# Patient Record
Sex: Female | Born: 1935 | Race: White | Hispanic: No | Marital: Married | Smoking: Former smoker
Health system: Southern US, Community
[De-identification: ages and names within clinical notes are randomized; demographics above are authoritative.]

## PROBLEM LIST (undated history)

## (undated) DIAGNOSIS — I771 Stricture of artery: Secondary | ICD-10-CM

## (undated) DIAGNOSIS — E785 Hyperlipidemia, unspecified: Secondary | ICD-10-CM

## (undated) DIAGNOSIS — IMO0001 Reserved for inherently not codable concepts without codable children: Secondary | ICD-10-CM

## (undated) DIAGNOSIS — I10 Essential (primary) hypertension: Secondary | ICD-10-CM

## (undated) DIAGNOSIS — N390 Urinary tract infection, site not specified: Secondary | ICD-10-CM

## (undated) DIAGNOSIS — Z0389 Encounter for observation for other suspected diseases and conditions ruled out: Secondary | ICD-10-CM

## (undated) HISTORY — DX: Encounter for observation for other suspected diseases and conditions ruled out: Z03.89

## (undated) HISTORY — DX: Hyperlipidemia, unspecified: E78.5

## (undated) HISTORY — DX: Urinary tract infection, site not specified: N39.0

## (undated) HISTORY — DX: Essential (primary) hypertension: I10

## (undated) HISTORY — DX: Stricture of artery: I77.1

## (undated) HISTORY — DX: Reserved for inherently not codable concepts without codable children: IMO0001

---

## 2005-04-22 ENCOUNTER — Other Ambulatory Visit: Admission: RE | Admit: 2005-04-22 | Discharge: 2005-04-22 | Payer: Self-pay | Admitting: Dermatology

## 2005-08-09 ENCOUNTER — Observation Stay (HOSPITAL_COMMUNITY): Admission: EM | Admit: 2005-08-09 | Discharge: 2005-08-10 | Payer: Self-pay | Admitting: Emergency Medicine

## 2007-02-17 IMAGING — CR DG CHEST 1V PORT
1 series · 1 of 1 positions shown · non-contrast
Comparison: none

CLINICAL DATA: Mid chest pain that extends into the right shoulder.
 PORTABLE CHEST - 1 VIEW - 08/09/05:

[view not recorded]
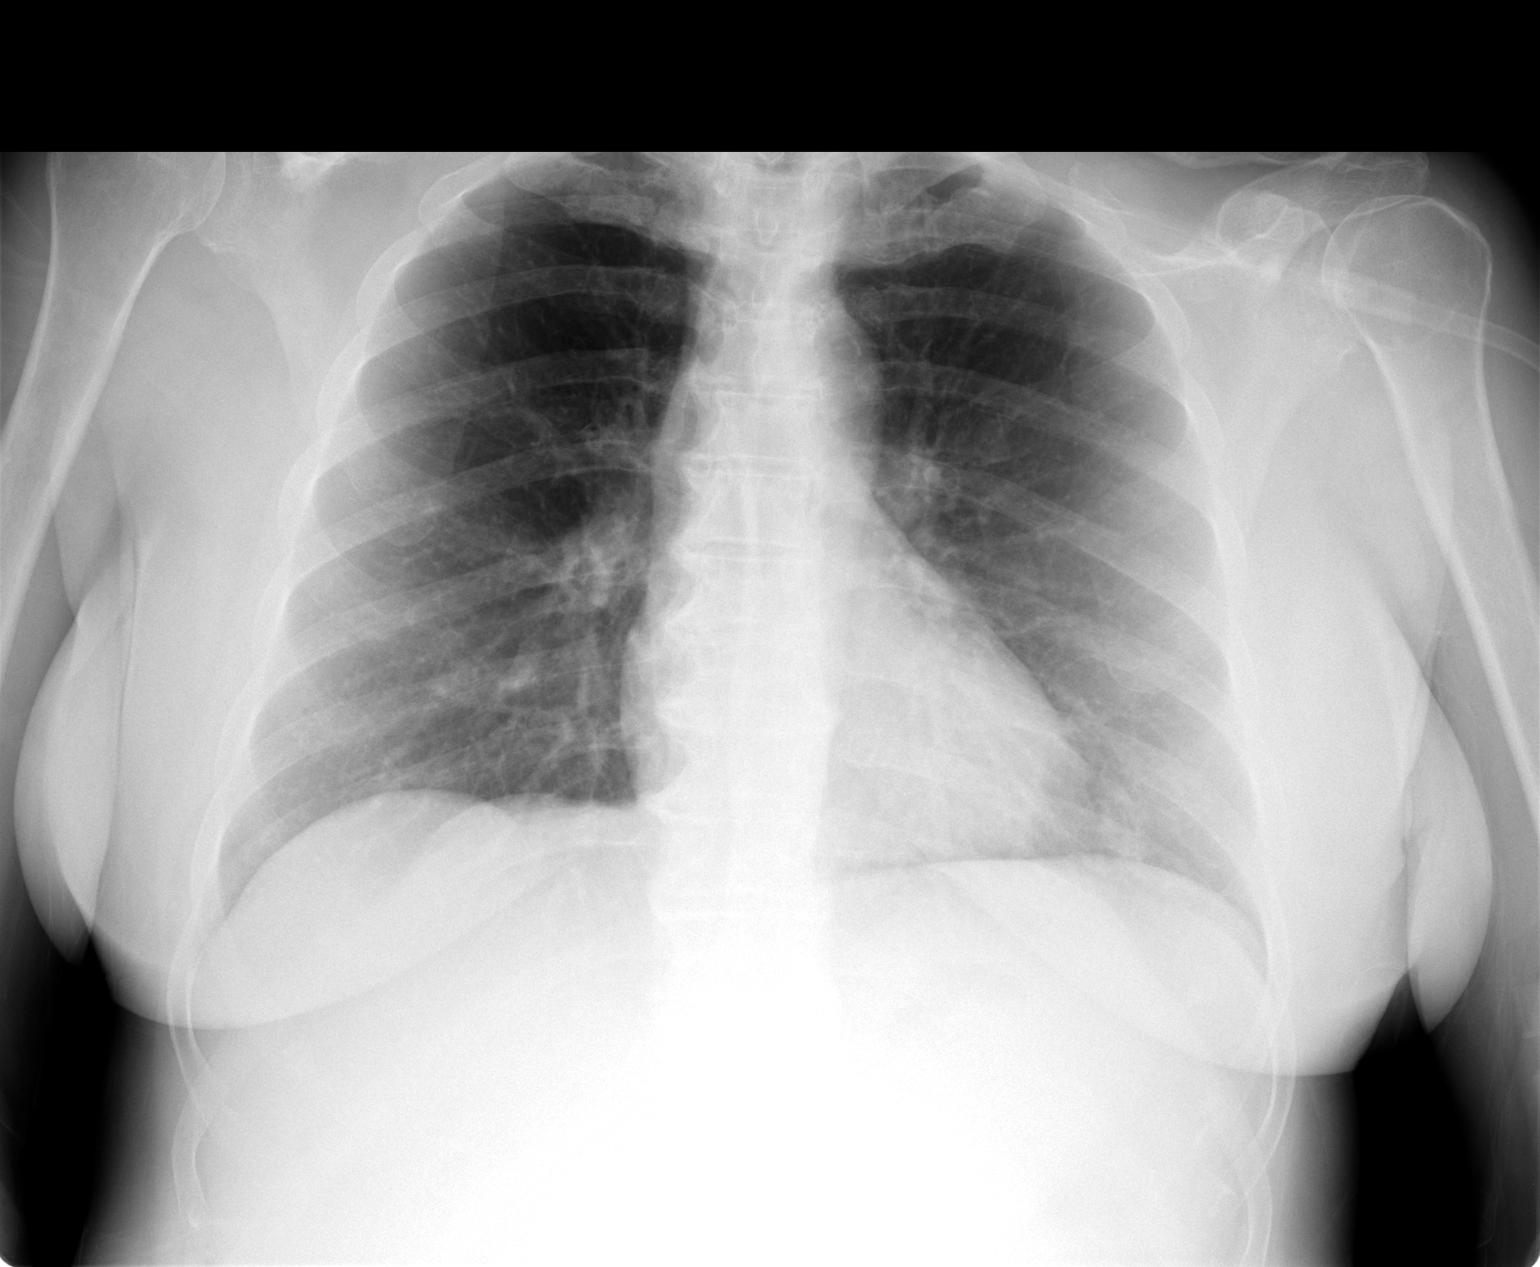

[1 of 1 positions shown; findings below may reference images not displayed]

FINDINGS: Negative for edema or effusions.  The heart is normal size.  There is a density in the left lower lung.  I suspect this is overlap of rib and vascular structures.  Follow-up PA and lateral chest is suggested when the patient is able.
IMPRESSION: 1.  Negative for edema or acute infiltrates.  
 2.  Density in the left lung likely overlap ? PA and lateral chest suggested when the patient is able.

## 2009-09-08 ENCOUNTER — Encounter: Admission: RE | Admit: 2009-09-08 | Discharge: 2009-09-08 | Payer: Self-pay | Admitting: Cardiovascular Disease

## 2009-09-12 ENCOUNTER — Inpatient Hospital Stay (HOSPITAL_COMMUNITY): Admission: AD | Admit: 2009-09-12 | Discharge: 2009-09-13 | Payer: Self-pay | Admitting: Cardiovascular Disease

## 2011-03-22 LAB — BASIC METABOLIC PANEL
BUN: 7 mg/dL (ref 6–23)
Calcium: 9.2 mg/dL (ref 8.4–10.5)
Creatinine, Ser: 1.13 mg/dL (ref 0.4–1.2)
Glucose, Bld: 107 mg/dL — ABNORMAL HIGH (ref 70–99)
Sodium: 137 mEq/L (ref 135–145)

## 2011-03-22 LAB — CBC
Hemoglobin: 12.3 g/dL (ref 12.0–15.0)
MCHC: 34 g/dL (ref 30.0–36.0)
RBC: 3.85 MIL/uL — ABNORMAL LOW (ref 3.87–5.11)
RDW: 13.2 % (ref 11.5–15.5)

## 2011-04-30 NOTE — Procedures (Signed)
NAME:  Samantha Frey, Samantha Frey                 ACCOUNT NO.:  [id number]   MEDICAL RECORD NO.:  [id number]          PATIENT TYPE:  AMB   LOCATION:  SDS                          FACILITY:  [facility]   PHYSICIAN:  Nanetta Batty, M.D.   DATE OF BIRTH:  July 15, 1934   DATE OF PROCEDURE:  DATE OF DISCHARGE:                    PERIPHERAL VASCULAR INVASIVE PROCEDURE   Samantha Frey is a 77 year old, moderately overweight, married, Caucasian  female patient of Tim Lair and Dr. Marquis Buggy.  She underwent a  cardiac catheterization today revealing normal coronary arteries and  normal LV function.  She has high-grade left subclavian artery stenosis  by Duplex ultrasound and symptoms compatible with subclavian steal.  She  presents now for angiography and potential intervention.   PROCEDURE DESCRIPTION:  The existing 5-French sheath in the right  femoral artery was exchanged over a wire for a 6-French long Terumo  sheath.  Angiography was performed of the right innominate and left  subclavian with a 5-French JR-4.  Arch angiography was performed with a  5-French pigtail catheter, as well as distal abdominal aortography.  Visipaque dye was used for the entirety of the case.  Retrograde aortic  pressure was monitored during the case.  A pullback pressure was  recorded across the subclavian stenosis.   ANGIOGRAPHIC RESULTS:  1. Arch aortogram:  Type 2 arch with arch vessels intact.  There was      retrograde filling of the left vertebral with delayed imaging.  2. Right innominate:  Selective right innominate angiography was      performed with delayed imaging revealing retrograde filling of the      left vertebral artery.  3. Left subclavian artery:  There was a 75% left subclavian artery      stenosis with a 60-mm pullback gradient.  4. Distal abdominal aortography:  Distal abdominal aortogram was      performed using 20 mL of Visipaque dye at 20 mL per second.  The      renal arteries were all patent.  The  inferior abdominal aorta had      an iliac bifurcation with __________significant atherosclerotic      changes.   IMPRESSION:  Samantha Frey has significant left subclavian artery stenosis  with subclavian steal physiology.  We will proceed with PPM stenting.   Using a 035-Wholey wire and a 5-French right Judkins catheter, the left  subclavian artery stenosis was crossed with the wire and the 6-French 90-  cm long Terumo sheath was then advanced to the origin of the left  subclavian artery.  Patient received 2500 units of heparin  intravenously.  Visipaque dye was used for the entirety of the case.  Retrograde aortic pressure was monitored throughout the case.  Using an  Omnilink 7 x 24 balloon expandible stent, direct stenting was performed  across the proximal left subclavian artery stenosis at 6 to 8  atmospheres by resulting reduction with 75% stenosis to 0% residual  without dissection or complication.  Patient tolerated the procedure  well.  The long 6-French sheath was then exchanged over a wire for a  short 6-French sheath and patient left  __________in  stable condition.  She was to be treated with aspirin and 300 mg of  Plavix load Plavix 75 mg a day.  She will be gently hydrated, discharged  home in the morning.  She will get followup upper extremity Dopplers.  I  will see her back in the office after that in followup.      Nanetta Batty, M.D.  Electronically Signed     JB/MEDQ  D:  09/12/2009  T:  09/12/2009  Job:  [id number]   cc:   [facility] [address], [facility]  [facility]  Lambert Keto, M.D.  Tim Lair, P.A.-C.

## 2011-05-03 NOTE — H&P (Signed)
NAME:  Samantha Frey, Samantha Frey NO.:  [id number]   MEDICAL RECORD NO.:  [id number]          PATIENT TYPE:  INP   LOCATION:  A203                          FACILITY:  [facility]   PHYSICIAN:  Vania Rea, M.D. DATE OF BIRTH:  05-25-1934   DATE OF ADMISSION:  08/09/2005  DATE OF DISCHARGE:  [facility]                                HISTORY & PHYSICAL   PRIMARY CARE PHYSICIAN:  [place]   CHIEF COMPLAINT:  Worsening chest pain.   HISTORY OF PRESENT ILLNESS:  This is a 77 year old Caucasian lady with a  history of hypertension, hyperlipidemia, and multiple incidences of early  cardiac death among members of her immediate family, who, for the past 2-3  yearshas, been having episodic crushing substernal chest pain precipitated  by walking uphill, but often while at rest. .  The patient has largely been  ignoring them, but today, after having a meal in a restaurant and walking  out to her car, she had sudden onset of familiar substernal pressure around  her chest, radiating up into her jaw and her right shoulder associated with  palpitations.  The pain persisted for an unusually long time on this  occasion, about 20 minutes rather than the usual 5-10 minutes, and it stayed  at about a level of 5/10; subsided to 2/10 until arrival at the [facility]  Emergency Room about 40 minutes after onset.  She then received sublingual  nitroglycerin which caused prompt relief of the pain.   The patient had no nausea, vomiting, diaphoresis, or sweating associated  with these symptoms, and she had no syncopal episode.  She has a history of  a 70% stenosis of the left internal carotid and has a history of a cardiac  catheterization 8-9 years ago at [facility] which she said was negative for  any obstruction.   She denies fever, cough, cold.  She denied orthopnea, PND, or lower  extremity edema.   PAST MEDICAL HISTORY:  1.  Hypertension.  2.  GERD.  3.  Anxiety.  4.   Hyperlipidemia.   MEDICATIONS:  1.  Norvasc 5 mg daily.  2.  Lorazepam 0.5 mg twice daily.  3.  Nexium 40 mg daily.  4.  Crestor 1 tab daily.  5.  Vitamin E daily.   PAST SURGICAL HISTORY:  1.  She had a hysterectomy last year for a uterine polyp which was malignant      but not invasive.  Surgery is considered to be curative.   ALLERGIES:  TETANUS TOXIDE causes chills and flu-like symptoms.   SOCIAL HISTORY:  She has been married for the past 7 years.  It is her  second marriage.  She was widowed after 39 years in her first marriage.  She  denies tobacco, alcohol, or illicit drug use.   FAMILY HISTORY:  Significant for a mother who died of an acute MI at age 65.  Her father died of an acute MI at age 43.  Two brothers who died of acute MI  at age 66 and 89.  A  sister who died of melanoma at age 3.  Another sister,  age 60, with lung cancer.  Three other siblings in apparent good health.  She has two grown children in good health.   REVIEW OF SYSTEMS:  She does suffer with arthritic joint pains occasionally  but other than that, the 10 point review of systems was negative.   PHYSICAL EXAMINATION:  GENERAL:  This is a pleasant elderly Caucasian lady  no longer having pain, lying in bed.  VITAL SIGNS:  97.1, pulse 80, respiration 20, blood pressure 169/87.  Her  height is listed as 64 inches, and her weight is 152.3 pounds.  HEENT:  His pupils are round and equal.  Mucous membranes are pink and  anicteric.  NECK:  She has no lymphadenopathy, no jugular venous distention.  She has  false teeth in the upper part of her mouth.  CHEST:  Clear to auscultation bilaterally.  CARDIOVASCULAR SYSTEM:  Regular rhythm.  She has an S4 gallop.  ABDOMEN:  Obese, soft, and nontender.  EXTREMITIES:  Without edema with pulses 2+ bilaterally.  CENTRAL NERVOUS SYSTEM:  She has no focal neurologic deficit.   LABORATORY DATA:  Her CBC is entirely normal.  Her serum chemistry is  significant for a  potassium of 3.4, glucose of 107, a BUN of 9 and a  creatinine of 1.1.  Her calcium was 8.8.  Her point of care markers were  negative.  Her EKG shows normal sinus rhythm with an early transition but no  ST with changes of ischemia.   ASSESSMENT:  1.  Unstable angina.  2.  Uncontrolled hypertension.  3.  History of hyperlipidemia.  4.  Anxiety disorder.   PLAN:  In view of this lady's strong cardiac history, her uncontrolled  hypertension, and fact that her pains are precipitated by a meal, probably  due to cardiac steal.  It would be wise to get a cardiology evaluation of  this lady prior to discharge.  We will therefore keep her on bed rest until  her cardiologist is available for stress testing or cardiac catheterization.  We will add a beta blocker to her medications for better blood pressure  control and reduction of cardiac risk.  We will check her fasting lipid  panel and continue her Crestor, and in view of the history of palpitations  and also check her thyroid function.     Vania Rea, M.D.  Electronically Signed    LC/MEDQ  D:  08/09/2005  T:  08/10/2005  Job:  [id number]

## 2014-10-28 ENCOUNTER — Encounter: Payer: Self-pay | Admitting: Cardiovascular Disease

## 2014-10-28 ENCOUNTER — Ambulatory Visit (INDEPENDENT_AMBULATORY_CARE_PROVIDER_SITE_OTHER): Payer: Medicare Other | Admitting: Cardiovascular Disease

## 2014-10-28 VITALS — BP 140/84 | HR 73 | Ht 63.0 in | Wt 164.3 lb

## 2014-10-28 DIAGNOSIS — I10 Essential (primary) hypertension: Secondary | ICD-10-CM | POA: Insufficient documentation

## 2014-10-28 DIAGNOSIS — I708 Atherosclerosis of other arteries: Secondary | ICD-10-CM

## 2014-10-28 DIAGNOSIS — I771 Stricture of artery: Secondary | ICD-10-CM | POA: Insufficient documentation

## 2014-10-28 DIAGNOSIS — I739 Peripheral vascular disease, unspecified: Secondary | ICD-10-CM

## 2014-10-28 DIAGNOSIS — E785 Hyperlipidemia, unspecified: Secondary | ICD-10-CM | POA: Insufficient documentation

## 2014-10-28 NOTE — Progress Notes (Signed)
10/28/2014 Samantha Frey   11/15/1934  [id number]  Primary Physician Pcp Not In System Primary Cardiologist: Runell GessJonathan J. Aziya Arena MD Roseanne RenoFACP,FACC,FAHA, FSCAI   HPI:  [name] is a 80 year old mildly overweight married Caucasian female who I saw a remotely back in June 2011. She is the mother of 2, grandmother of 3 grandchildren. She has a history of remote tobacco abuse, hypertension, hyperlipidemia and left subclavian artery stenosis. I performed coronary angiography and her because of chest pain 09/12/09 revealing normal coronary arteries and normal left ventricular function. I stented her left subclavian artery 09/12/09 with a 7 mm x 24 mm millimeter long Omnilink stent with excellent angiographic and clinical result. She currently denies chest pain or shortness of breath.   Current Outpatient Prescriptions  Medication Sig Dispense Refill  . amLODipine (NORVASC) 10 MG tablet Take 10 mg by mouth 2 (two) times daily.    Marland Kitchen. aspirin 81 MG tablet Take 81 mg by mouth daily.    . Cholecalciferol (VITAMIN D-3) 1000 UNITS CAPS Take 1 capsule by mouth daily.    Marland Kitchen. doxylamine, Sleep, (UNISOM) 25 MG tablet Take 25 mg by mouth at bedtime as needed.    Marland Kitchen. esomeprazole (NEXIUM) 40 MG capsule Take 40 mg by mouth daily at 12 noon.    Marland Kitchen. lisinopril (PRINIVIL,ZESTRIL) 10 MG tablet Take 10 mg by mouth daily.    . metoprolol (LOPRESSOR) 50 MG tablet Take 50 mg by mouth 2 (two) times daily. 0.5 tablet twice a day    . Omega-3 Fatty Acids (FISH OIL) 1200 MG CAPS Take 2 capsules by mouth 2 (two) times daily.    . rosuvastatin (CRESTOR) 10 MG tablet Take 10 mg by mouth daily.     No current facility-administered medications for this visit.    Allergies  Allergen Reactions  . Tetanus Toxoids     Flu like symptoms-muscle soreness and aching    History   Social History  . Marital Status: Married    Spouse Name: N/A    Number of Children: N/A  . Years of Education: N/A   Occupational History  . Not on  file.   Social History Main Topics  . Smoking status: Former Games developermoker  . Smokeless tobacco: Not on file  . Alcohol Use: Not on file  . Drug Use: Not on file  . Sexual Activity: Not on file   Other Topics Concern  . Not on file   Social History Narrative  . No narrative on file     Review of Systems: General: negative for chills, fever, night sweats or weight changes.  Cardiovascular: negative for chest pain, dyspnea on exertion, edema, orthopnea, palpitations, paroxysmal nocturnal dyspnea or shortness of breath Dermatological: negative for rash Respiratory: negative for cough or wheezing Urologic: negative for hematuria Abdominal: negative for nausea, vomiting, diarrhea, bright red blood per rectum, melena, or hematemesis Neurologic: negative for visual changes, syncope, or dizziness All other systems reviewed and are otherwise negative except as noted above.    Blood pressure 140/84, pulse 73, height 5\' 3"  (1.6 m), weight 164 lb 4.8 oz (74.526 kg).    EKG normal sinus rhythm at 73 without ST or T-wave changes. I personally reviewed this EKG  ASSESSMENT AND PLAN:   Essential hypertension History of hypertension with blood pressure measured today at 140/84. She is on amlodipine and lisinopril. We will keep her on the same medications at her current doses  Hyperlipidemia On Crestor 10 mg. Recent lab work performed by  her primary care physician 02/14/14 revealed a total cholesterol of 193, LDL of 91 and HDL of 60.  Subclavian artery stenosis, left Status post left subclavian artery PTA and stenting by myself 09/12/09. She does have carotid and subclavian bruits. There is been several years since she's had upper extremity Doppler studies which I will repeat.      Runell GessJonathan J. Xariah Silvernail MD FACP,FACC,FAHA, [facility]SC[facility] 10/28/2014 3:10 PM

## 2014-10-28 NOTE — Assessment & Plan Note (Signed)
Status post left subclavian artery PTA and stenting by myself 09/12/09. She does have carotid and subclavian bruits. There is been several years since she's had upper extremity Doppler studies which I will repeat.

## 2014-10-28 NOTE — Assessment & Plan Note (Signed)
History of hypertension with blood pressure measured today at 140/84. She is on amlodipine and lisinopril. We will keep her on the same medications at her current doses

## 2014-10-28 NOTE — Assessment & Plan Note (Signed)
On Crestor 10 mg. Recent lab work performed by her primary care physician 02/14/14 revealed a total cholesterol of 193, LDL of 91 and HDL of 60.

## 2014-10-28 NOTE — Patient Instructions (Signed)
Your physician has requested that you have a carotid duplex. This test is an ultrasound of the carotid arteries in your neck. It looks at blood flow through these arteries that supply the brain with blood. Allow one hour for this exam. There are no restrictions or special instructions.  Your physician has requested that you have a upper extremity arterial duplex. This test is an ultrasound of the arteries in the arms. It looks at arterial blood flow in the arms. Allow one hour for Upper Arterial scans. There are no restrictions or special instructions.  Dr Allyson SabalBerry recommends that you schedule a follow-up appointment in 1 year. You will receive a reminder letter in the mail two months in advance. If you don't receive a letter, please call our office to schedule the follow-up appointment.

## 2014-11-18 ENCOUNTER — Ambulatory Visit (HOSPITAL_BASED_OUTPATIENT_CLINIC_OR_DEPARTMENT_OTHER)
Admission: RE | Admit: 2014-11-18 | Discharge: 2014-11-18 | Disposition: A | Discharge status: Home / Self Care | Payer: Medicare Other | Source: Ambulatory Visit | Attending: Cardiovascular Disease | Admitting: Cardiovascular Disease

## 2014-11-18 ENCOUNTER — Ambulatory Visit (HOSPITAL_COMMUNITY)
Admission: RE | Admit: 2014-11-18 | Discharge: 2014-11-18 | Disposition: A | Discharge status: Home / Self Care | Payer: Medicare Other | Source: Ambulatory Visit | Attending: Internal Medicine | Admitting: Internal Medicine

## 2014-11-18 DIAGNOSIS — I672 Cerebral atherosclerosis: Secondary | ICD-10-CM | POA: Insufficient documentation

## 2014-11-18 DIAGNOSIS — I6523 Occlusion and stenosis of bilateral carotid arteries: Secondary | ICD-10-CM | POA: Insufficient documentation

## 2014-11-18 DIAGNOSIS — I739 Peripheral vascular disease, unspecified: Secondary | ICD-10-CM

## 2014-11-18 NOTE — Progress Notes (Signed)
Lower Extremity Arterial Duplex Completed. °Brianna L Mazza,RVT °

## 2014-11-18 NOTE — Progress Notes (Signed)
Carotid Duplex Completed. °Brianna L Mazza,RVT °

## 2014-12-06 ENCOUNTER — Encounter: Payer: Self-pay | Admitting: *Deleted

## 2015-05-10 ENCOUNTER — Encounter: Payer: Self-pay | Admitting: Cardiovascular Disease
